# Patient Record
Sex: Male | Born: 1995 | Race: Black or African American | Hispanic: No | Marital: Single | Smoking: Never smoker
Health system: Southern US, Community
[De-identification: ages and names within clinical notes are randomized; demographics above are authoritative.]

## PROBLEM LIST (undated history)

## (undated) DIAGNOSIS — M069 Rheumatoid arthritis, unspecified: Secondary | ICD-10-CM

## (undated) DIAGNOSIS — M419 Scoliosis, unspecified: Secondary | ICD-10-CM

---
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
## 2017-04-30 ENCOUNTER — Emergency Department (HOSPITAL_COMMUNITY): Payer: Managed Care, Other (non HMO)

## 2017-04-30 ENCOUNTER — Other Ambulatory Visit: Payer: Self-pay

## 2017-04-30 ENCOUNTER — Encounter (HOSPITAL_COMMUNITY): Payer: Self-pay | Admitting: Emergency Medicine

## 2017-04-30 ENCOUNTER — Emergency Department (HOSPITAL_COMMUNITY)
Admission: EM | Admit: 2017-04-30 | Discharge: 2017-04-30 | Disposition: A | Discharge status: Home / Self Care | Payer: Managed Care, Other (non HMO) | Attending: Emergency Medicine | Admitting: Emergency Medicine

## 2017-04-30 DIAGNOSIS — M25551 Pain in right hip: Secondary | ICD-10-CM | POA: Insufficient documentation

## 2017-04-30 DIAGNOSIS — S8001XA Contusion of right knee, initial encounter: Secondary | ICD-10-CM | POA: Diagnosis not present

## 2017-04-30 DIAGNOSIS — Y998 Other external cause status: Secondary | ICD-10-CM | POA: Insufficient documentation

## 2017-04-30 DIAGNOSIS — Y92310 Basketball court as the place of occurrence of the external cause: Secondary | ICD-10-CM | POA: Diagnosis not present

## 2017-04-30 DIAGNOSIS — W51XXXA Accidental striking against or bumped into by another person, initial encounter: Secondary | ICD-10-CM | POA: Diagnosis not present

## 2017-04-30 DIAGNOSIS — S8991XA Unspecified injury of right lower leg, initial encounter: Secondary | ICD-10-CM | POA: Diagnosis present

## 2017-04-30 DIAGNOSIS — M25511 Pain in right shoulder: Secondary | ICD-10-CM | POA: Diagnosis not present

## 2017-04-30 DIAGNOSIS — Y9367 Activity, basketball: Secondary | ICD-10-CM | POA: Diagnosis not present

## 2017-04-30 MED ORDER — IBUPROFEN 800 MG PO TABS
800.0000 mg | ORAL_TABLET | Freq: Once | ORAL | Status: AC
  Administered 2017-04-30: 800 mg via ORAL
  Filled 2017-04-30: qty 1

## 2017-04-30 MED ORDER — IBUPROFEN 600 MG PO TABS: 600.0000 mg | ORAL_TABLET | Freq: Four times a day (QID) | ORAL | 0 refills | Status: AC | PRN

## 2017-04-30 NOTE — ED Provider Notes (Signed)
[facility] EMERGENCY DEPARTMENT Provider Note   CSN: [id number] Arrival date & time: 04/30/17  0136     History   Chief Complaint Chief Complaint  Patient presents with  . Hip Injury    R  . Shoulder Injury    R  . Knee Injury    R    HPI Erik Newman is a 22 y.o. male.   22 year old male presents to the emergency department for evaluation of pain to his right shoulder, hip, knee.  He states that he was playing basketball yesterday when someone pushed his legs from underneath him.  He reports falling on the concrete on his right side.  He reports increasing soreness to his right side which is slightly aggravated with ambulation.  He has not taken any medications for his pain.  No associated head injury or loss of consciousness.  No associated numbness, weakness.      History reviewed. No pertinent past medical history.  There are no active problems to display for this patient.   History reviewed. No pertinent surgical history.     Home Medications    Prior to Admission medications   Medication Sig Start Date End Date Taking? Authorizing Provider  fluticasone (CUTIVATE) 0.05 % cream Apply topically 2 (two) times daily. 10/01/13   Linna Hoff, MD  ibuprofen (ADVIL,MOTRIN) 600 MG tablet Take 1 tablet (600 mg total) by mouth every 6 (six) hours as needed. 04/30/17   Antony Madura, PA-C    Family History History reviewed. No pertinent family history.  Social History Social History   Tobacco Use  . Smoking status: Never Smoker  Substance Use Topics  . Alcohol use: No  . Drug use: No     Allergies   Patient has no known allergies.   Review of Systems Review of Systems Ten systems reviewed and are negative for acute change, except as noted in the HPI.    Physical Exam Updated Vital Signs BP (!) 107/53 (BP Location: Right Arm)   Pulse (!) 58   Temp 98.6 F (37 C) (Oral)   Resp 16   Ht 6' (1.829 m)   Wt 74.8 kg (165 lb)   SpO2  100%   BMI 22.38 kg/m   Physical Exam  Constitutional: He is oriented to person, place, and time. He appears well-developed and well-nourished. No distress.  Nontoxic appearing and in no acute distress  HENT:  Head: Normocephalic and atraumatic.  Eyes: Conjunctivae and EOM are normal. No scleral icterus.  Neck: Normal range of motion.  Cardiovascular: Normal rate, regular rhythm and intact distal pulses.  Pulmonary/Chest: Effort normal. No respiratory distress.  Respirations even and unlabored  Musculoskeletal: Normal range of motion.       Right shoulder: He exhibits tenderness. He exhibits normal range of motion, no spasm, normal pulse and normal strength.       Right hip: He exhibits tenderness. He exhibits normal range of motion, no crepitus and no deformity.       Right knee: He exhibits normal range of motion, normal alignment, no LCL laxity and no MCL laxity.       Arms:      Legs: Tenderness to palpation to the right scapula as well as the right hip and pelvis.  Normal range of motion of the right lower extremity at the hip and knee joint.  No leg shortening or malrotation.  No bony deformity or crepitus appreciated on exam.  Neurological: He is alert and oriented  to person, place, and time. He exhibits normal muscle tone. Coordination normal.  Patient ambulatory with steady gait.  Skin: Skin is warm and dry. No rash noted. He is not diaphoretic. No erythema. No pallor.  Psychiatric: He has a normal mood and affect. His behavior is normal.  Nursing note and vitals reviewed.    ED Treatments / Results  Labs (all labs ordered are listed, but only abnormal results are displayed) Labs Reviewed - No data to display  EKG  EKG Interpretation None       Radiology Dg Pelvis 1-2 Views  Result Date: 04/30/2017 CLINICAL DATA:  Fall during basketball game with pelvic pain. EXAM: PELVIS - 1-2 VIEW COMPARISON:  None. FINDINGS: The cortical margins of the bony pelvis are intact. No  fracture. Pubic symphysis and sacroiliac joints are congruent. Both femoral heads are well-seated in the respective acetabula. IMPRESSION: Negative radiograph of the pelvis. Electronically Signed   By: Rubye OaksMelanie  Ehinger M.D.   On: 04/30/2017 03:11   Dg Scapula Right  Result Date: 04/30/2017 CLINICAL DATA:  Right shoulder/scapular pain after fall playing basketball yesterday. EXAM: RIGHT SCAPULA - 2+ VIEWS COMPARISON:  None. FINDINGS: There is no evidence of fracture or other focal bone lesions. Soft tissues are unremarkable. IMPRESSION: Negative radiographs of the right scapula. Electronically Signed   By: Rubye OaksMelanie  Ehinger M.D.   On: 04/30/2017 03:14   Dg Knee Complete 4 Views Right  Result Date: 04/30/2017 CLINICAL DATA:  Right knee pain after fall yesterday playing basketball. EXAM: RIGHT KNEE - COMPLETE 4+ VIEW COMPARISON:  None. FINDINGS: No evidence of fracture, dislocation, or joint effusion. Incidental bone island in the proximal lateral tibia. No evidence of arthropathy or other focal bone abnormality. Soft tissues are unremarkable. IMPRESSION: Negative radiographs of the right knee. Electronically Signed   By: Rubye OaksMelanie  Ehinger M.D.   On: 04/30/2017 03:16    Procedures Procedures (including critical care time)  Medications Ordered in ED Medications  ibuprofen (ADVIL,MOTRIN) tablet 800 mg (800 mg Oral Given 04/30/17 0223)     Initial Impression / Assessment and Plan / ED Course  I have reviewed the triage vital signs and the nursing notes.  Pertinent labs & imaging results that were available during my care of the patient were reviewed by me and considered in my medical decision making (see chart for details).     Patient presents to the emergency department for evaluation of R shoulder, hip, and knee pain. Patient neurovascularly intact on exam; ambulatory. Imaging negative for fracture, dislocation, bony deformity. Plan for supportive management including RICE and NSAIDs; primary care  follow up as needed. Return precautions discussed and provided. Patient discharged in stable condition with no unaddressed concerns.   Final Clinical Impressions(s) / ED Diagnoses   Final diagnoses:  Right hip pain  Acute pain of right shoulder  Contusion of right knee, initial encounter    ED Discharge Orders        Ordered    ibuprofen (ADVIL,MOTRIN) 600 MG tablet  Every 6 hours PRN     04/30/17 0328       Antony MaduraHumes, Lakeia Bradshaw, PA-C 04/30/17 0354    Azalia Bilisampos, Kevin, MD 04/30/17 [phone]

## 2017-04-30 NOTE — ED Notes (Signed)
Patient transported to X-ray 

## 2017-04-30 NOTE — ED Notes (Signed)
Pt to xray via stretcher

## 2017-04-30 NOTE — ED Triage Notes (Signed)
Pt presents to ER for injuries after falling during basketball game yesterday; pt states he jumped and was slammed into someone causing him to fall onto his R side, pt c/o pain to R wrist, shoulder, hip and knee

## 2017-04-30 NOTE — ED Notes (Signed)
PA at bedside for evaluation

## 2017-04-30 NOTE — ED Notes (Signed)
Patient currently at x-ray .  

## 2019-02-09 IMAGING — CR DG SCAPULA*R*
2 series · 2 of 2 positions shown · non-contrast
Comparison: None.

CLINICAL DATA: Right shoulder/scapular pain after fall playing
basketball yesterday.

EXAM:
RIGHT SCAPULA - 2+ VIEWS

[scapula ap]
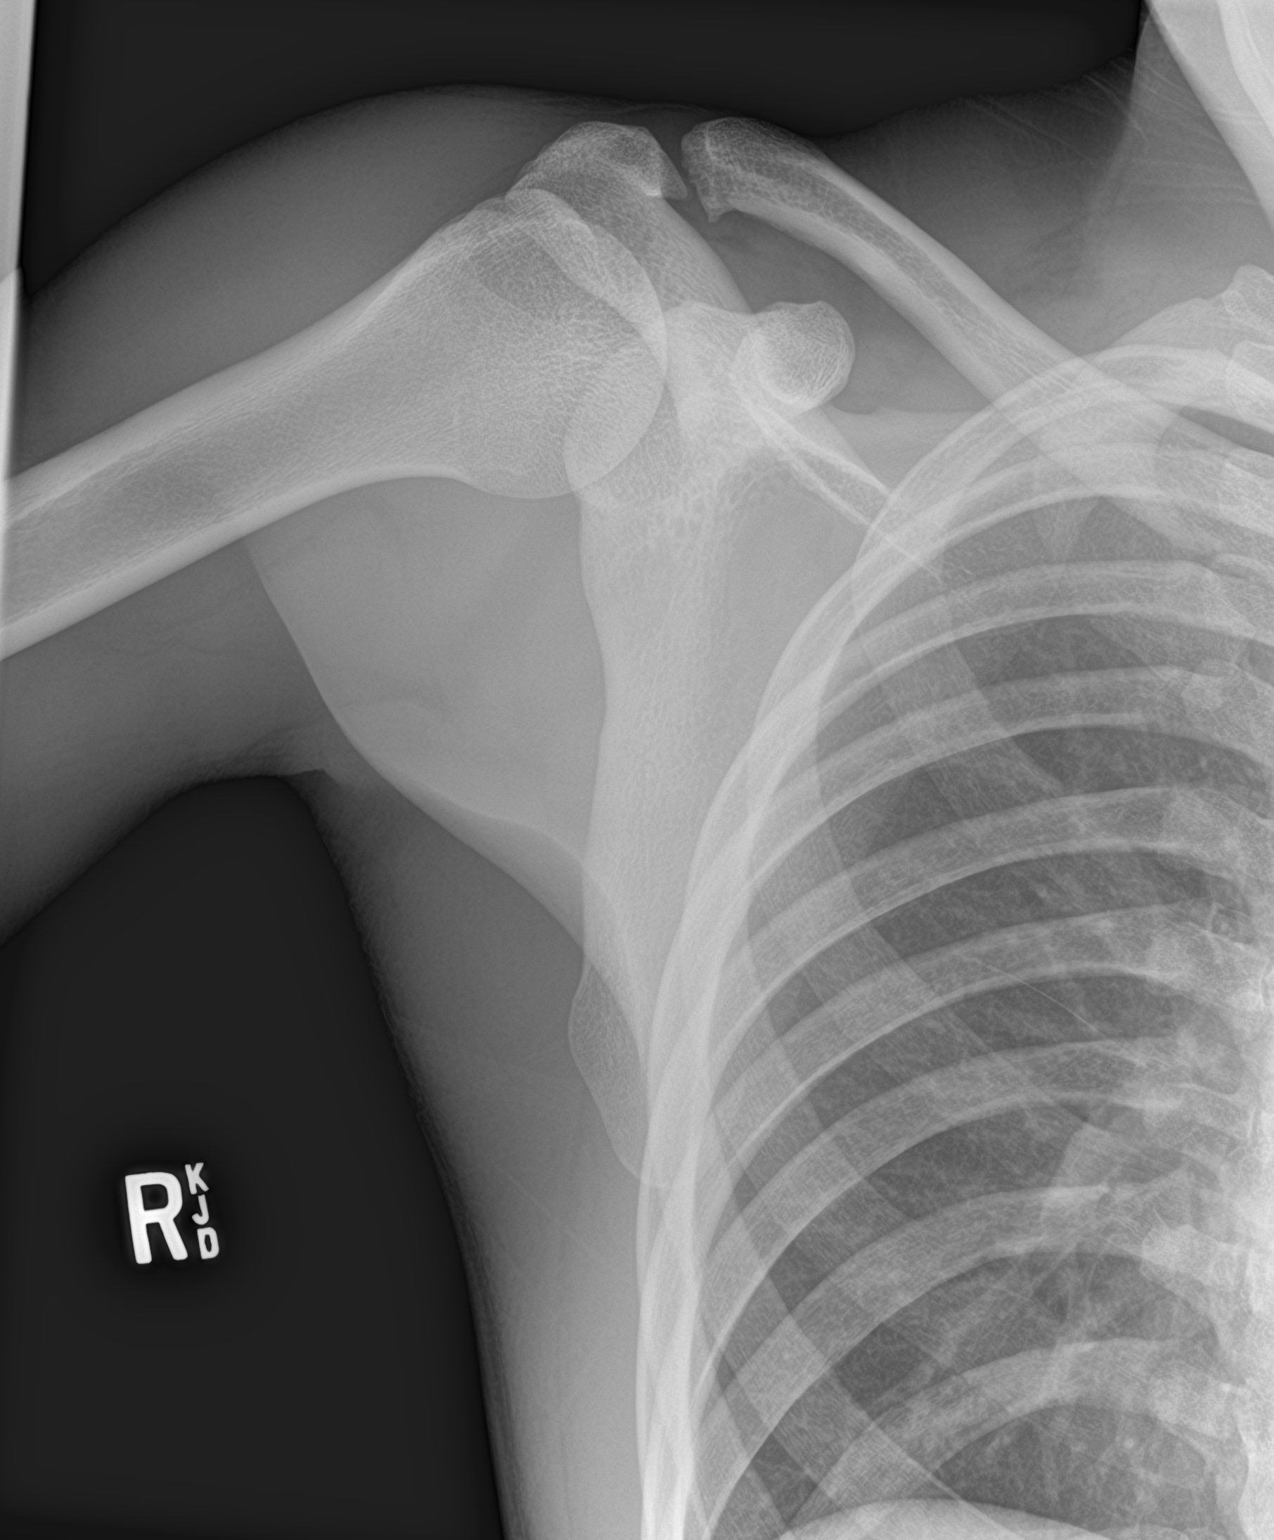

[scapula lat]
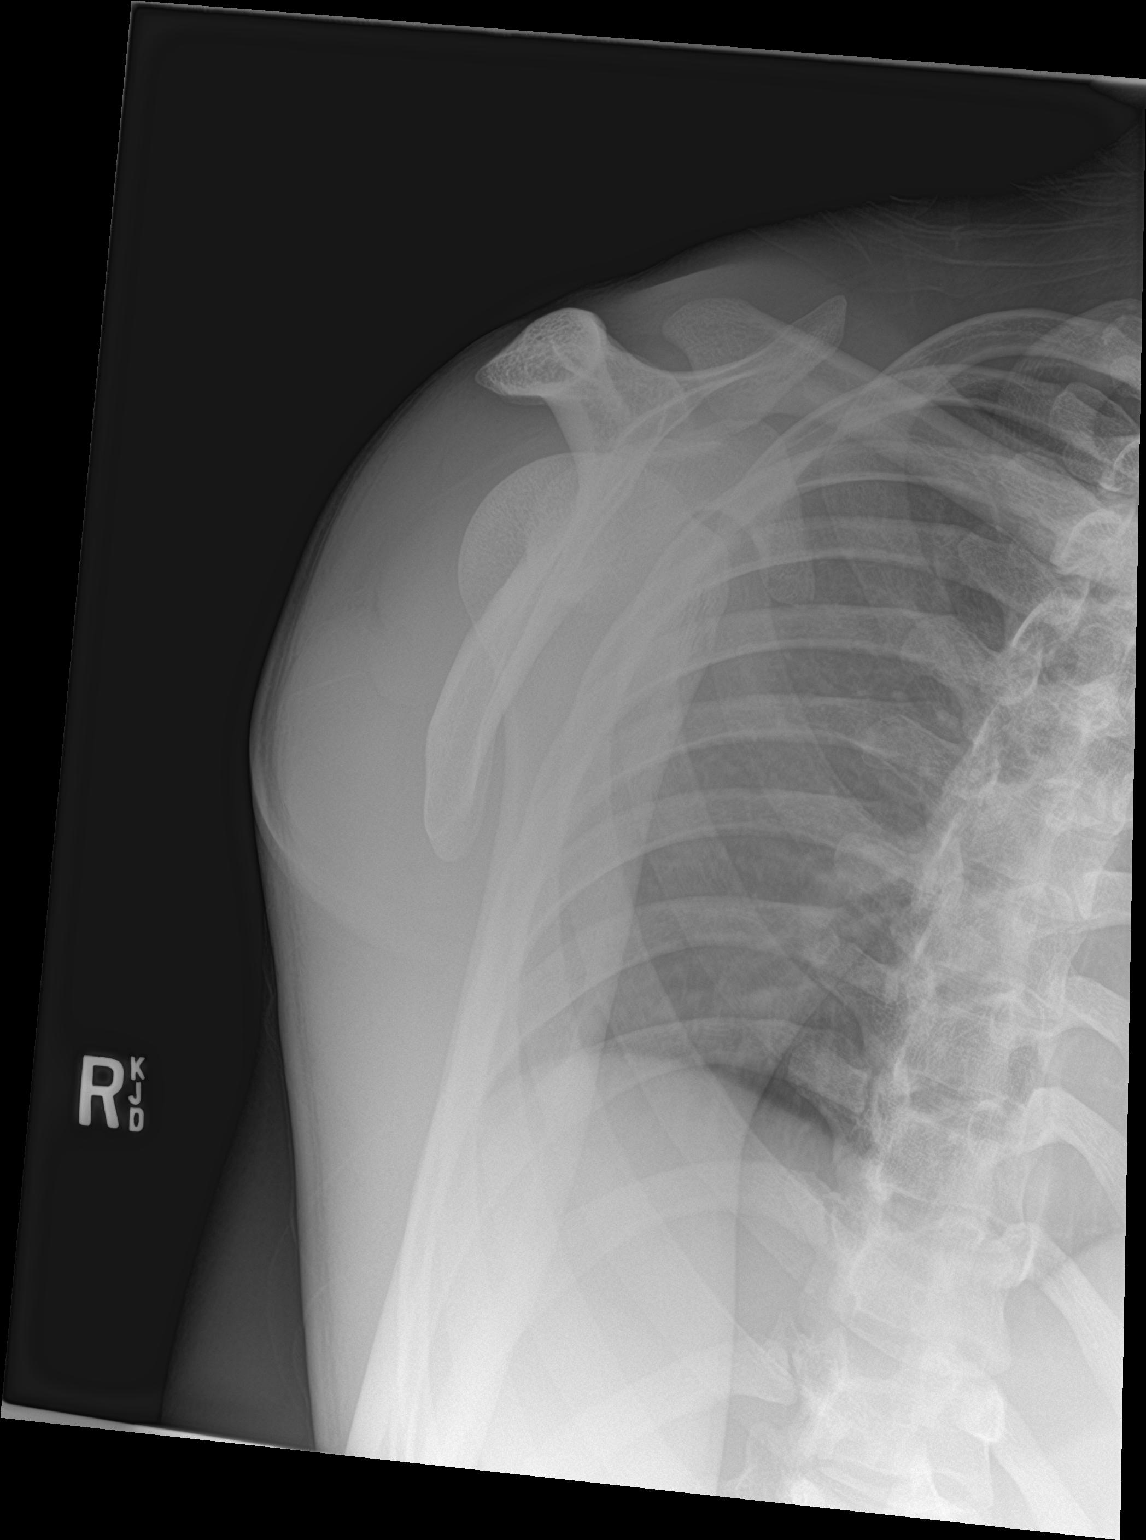

[2 of 2 positions shown; findings below may reference images not displayed]

FINDINGS: There is no evidence of fracture or other focal bone lesions. Soft
tissues are unremarkable.
IMPRESSION: Negative radiographs of the right scapula.

## 2020-07-25 ENCOUNTER — Other Ambulatory Visit: Payer: Self-pay

## 2020-07-25 ENCOUNTER — Emergency Department (HOSPITAL_BASED_OUTPATIENT_CLINIC_OR_DEPARTMENT_OTHER)
Admission: EM | Admit: 2020-07-25 | Discharge: 2020-07-25 | Disposition: A | Discharge status: Home / Self Care | Payer: Managed Care, Other (non HMO) | Attending: Emergency Medicine | Admitting: Emergency Medicine

## 2020-07-25 ENCOUNTER — Encounter (HOSPITAL_BASED_OUTPATIENT_CLINIC_OR_DEPARTMENT_OTHER): Payer: Self-pay | Admitting: Emergency Medicine

## 2020-07-25 DIAGNOSIS — S39012A Strain of muscle, fascia and tendon of lower back, initial encounter: Secondary | ICD-10-CM

## 2020-07-25 DIAGNOSIS — M62830 Muscle spasm of back: Secondary | ICD-10-CM | POA: Insufficient documentation

## 2020-07-25 HISTORY — DX: Rheumatoid arthritis, unspecified: M06.9

## 2020-07-25 HISTORY — DX: Scoliosis, unspecified: M41.9

## 2020-07-25 MED ORDER — KETOROLAC TROMETHAMINE 60 MG/2ML IM SOLN
30.0000 mg | Freq: Once | INTRAMUSCULAR | Status: AC
  Administered 2020-07-25: 30 mg via INTRAMUSCULAR
  Filled 2020-07-25: qty 2

## 2020-07-25 MED ORDER — DEXAMETHASONE SODIUM PHOSPHATE 10 MG/ML IJ SOLN
8.0000 mg | Freq: Once | INTRAMUSCULAR | Status: AC
  Administered 2020-07-25: 8 mg via INTRAMUSCULAR
  Filled 2020-07-25: qty 1

## 2020-07-25 NOTE — ED Provider Notes (Signed)
[facility] EMERGENCY DEPARTMENT Provider Note  CSN: [id number] Arrival date & time: 07/25/20 0355  Chief Complaint(s) Back Pain  HPI Erik Newman is a 25 y.o. male here with exacerbation of chronic recurrent back spasms.  Tonight's episode occurred while trying to get out of bed.  Reports that symptoms are coming on earlier in the day after stretching but became worse tonight.  He reports going through a move which includes packing moving boxes.  He denies any falls or trauma.  No bladder/bowel incontinence.  No lower extremity weakness or loss of sensation.  HPI  Past Medical History Past Medical History:  Diagnosis Date  . Arthritis, rheumatoid (HCC)   . Scoliosis    There are no problems to display for this patient.  Home Medication(s) Prior to Admission medications   Medication Sig Start Date End Date Taking? Authorizing Provider  fluticasone (CUTIVATE) 0.05 % cream Apply topically 2 (two) times daily. 10/01/13   Linna Hoff, MD  ibuprofen (ADVIL,MOTRIN) 600 MG tablet Take 1 tablet (600 mg total) by mouth every 6 (six) hours as needed. 04/30/17   Antony Madura, PA-C                                                                                                                                    Past Surgical History History reviewed. No pertinent surgical history. Family History No family history on file.  Social History Social History   Tobacco Use  . Smoking status: Never Smoker  Substance Use Topics  . Alcohol use: No  . Drug use: No   Allergies Patient has no known allergies.  Review of Systems Review of Systems All other systems are reviewed and are negative for acute change except as noted in the HPI  Physical Exam Vital Signs  I have reviewed the triage vital signs BP 133/61   Pulse 97   Temp 99.8 F (37.7 C) (Oral)   Resp 16   Ht 6' (1.829 m)   Wt 77.1 kg   SpO2 100%   BMI 23.06 kg/m   Physical Exam Vitals reviewed.   Constitutional:      General: He is not in acute distress.    Appearance: He is well-developed. He is not diaphoretic.  HENT:     Head: Normocephalic and atraumatic.     Jaw: No trismus.     Right Ear: External ear normal.     Left Ear: External ear normal.     Nose: Nose normal.  Eyes:     General: No scleral icterus.    Conjunctiva/sclera: Conjunctivae normal.  Neck:     Trachea: Phonation normal.  Cardiovascular:     Rate and Rhythm: Normal rate and regular rhythm.  Pulmonary:     Effort: Pulmonary effort is normal. No respiratory distress.     Breath sounds: No stridor.  Abdominal:     General: There is no distension.  Musculoskeletal:        General: Normal range of motion.     Cervical back: Normal range of motion.     Thoracic back: Spasms and tenderness present.     Lumbar back: Spasms and tenderness present.       Back:  Neurological:     Mental Status: He is alert and oriented to person, place, and time.     Gait: Tandem gait test: .     Comments: Spine Exam: Strength: 5/5 throughout LE bilaterally  Sensation: Intact to light touch in proximal and distal LE bilaterally    Psychiatric:        Behavior: Behavior normal.     ED Results and Treatments Labs (all labs ordered are listed, but only abnormal results are displayed) Labs Reviewed - No data to display                                                                                                                       EKG  EKG Interpretation  Date/Time:    Ventricular Rate:    PR Interval:    QRS Duration:   QT Interval:    QTC Calculation:   R Axis:     Text Interpretation:        Radiology No results found.  Pertinent labs & imaging results that were available during my care of the patient were reviewed by me and considered in my medical decision making (see chart for details).  Medications Ordered in ED Medications  ketorolac (TORADOL) injection 30 mg (30 mg Intramuscular Given  07/25/20 0504)  dexamethasone (DECADRON) injection 8 mg (8 mg Intramuscular Given 07/25/20 0504)                                                                                                                                    Procedures Procedures  (including critical care time)  Medical Decision Making / ED Course I have reviewed the nursing notes for this encounter and the patient's prior records (if available in EHR or on provided paperwork).   Erik Newman was evaluated in Emergency Department on 07/25/2020 for the symptoms described in the history of present illness. He was evaluated in the context of the global COVID-19 pandemic, which necessitated consideration that the patient might be at risk for infection with the SARS-CoV-2 virus that causes COVID-19. Institutional protocols and algorithms that pertain to the evaluation of  patients at risk for COVID-19 are in a state of rapid change based on information released by regulatory bodies including the CDC and federal and state organizations. These policies and algorithms were followed during the patient's care in the ED.   Exacerbation appreciated muscular back spasms. No trauma, or evidence concerning for cauda equina. No indication for imaging at this time.  Patient's pain and mobility improved with IM Toradol. Continue supportive management recommended      Final Clinical Impression(s) / ED Diagnoses Final diagnoses:  None   The patient appears reasonably screened and/or stabilized for discharge and I doubt any other medical condition or other [facility] requiring further screening, evaluation, or treatment in the ED at this time prior to discharge. Safe for discharge with strict return precautions.  Disposition: Discharge  Condition: Good  I have discussed the results, Dx and Tx plan with the patient/family who expressed understanding and agree(s) with the plan. Discharge instructions discussed at length. The patient/family was  given strict return precautions who verbalized understanding of the instructions. No further questions at time of discharge.    ED Discharge Orders    None        Follow Up: Primary care provider  Call        This chart was dictated using voice recognition software.  Despite best efforts to proofread,  errors can occur which can change the documentation meaning.   Nira Conn, MD 07/25/20 [phone]

## 2020-07-25 NOTE — Discharge Instructions (Signed)
You may use over-the-counter Motrin (Ibuprofen), Acetaminophen (Tylenol), topical muscle creams such as SalonPas, Icy Hot, Bengay, etc. Please stretch, apply ice or heat (whichever helps), and have massage therapy for additional assistance.  

## 2020-07-25 NOTE — ED Triage Notes (Signed)
Pt c/o back spasm from mid to lower back. States he was just sitting when it started.
